# Patient Record
Sex: Male | Born: 1975 | Race: Black or African American | Hispanic: No | Marital: Single | Smoking: Current every day smoker
Health system: Southern US, Community
[De-identification: ages and names within clinical notes are randomized; demographics above are authoritative.]

## PROBLEM LIST (undated history)

## (undated) DIAGNOSIS — N390 Urinary tract infection, site not specified: Secondary | ICD-10-CM

## (undated) HISTORY — PX: HAND SURGERY: SHX662

---

## 1997-10-21 ENCOUNTER — Emergency Department (HOSPITAL_COMMUNITY): Admission: EM | Admit: 1997-10-21 | Discharge: 1997-10-21 | Payer: Self-pay | Admitting: Emergency Medicine

## 1997-10-24 ENCOUNTER — Emergency Department (HOSPITAL_COMMUNITY): Admission: EM | Admit: 1997-10-24 | Discharge: 1997-10-24 | Payer: Self-pay | Admitting: Emergency Medicine

## 1997-10-24 ENCOUNTER — Emergency Department (HOSPITAL_COMMUNITY): Admission: EM | Admit: 1997-10-24 | Discharge: 1997-10-24 | Payer: Self-pay | Admitting: *Deleted

## 1997-10-29 ENCOUNTER — Ambulatory Visit (HOSPITAL_COMMUNITY): Admission: RE | Admit: 1997-10-29 | Discharge: 1997-10-29 | Payer: Self-pay | Admitting: Urology

## 1998-01-13 ENCOUNTER — Emergency Department (HOSPITAL_COMMUNITY): Admission: EM | Admit: 1998-01-13 | Discharge: 1998-01-13 | Payer: Self-pay | Admitting: Emergency Medicine

## 1998-01-21 ENCOUNTER — Emergency Department (HOSPITAL_COMMUNITY): Admission: EM | Admit: 1998-01-21 | Discharge: 1998-01-21 | Payer: Self-pay | Admitting: Endocrinology

## 1998-03-02 ENCOUNTER — Emergency Department (HOSPITAL_COMMUNITY): Admission: EM | Admit: 1998-03-02 | Discharge: 1998-03-02 | Payer: Self-pay

## 1999-11-29 ENCOUNTER — Emergency Department (HOSPITAL_COMMUNITY): Admission: EM | Admit: 1999-11-29 | Discharge: 1999-11-29 | Payer: Self-pay | Admitting: Emergency Medicine

## 2000-11-23 ENCOUNTER — Emergency Department (HOSPITAL_COMMUNITY): Admission: EM | Admit: 2000-11-23 | Discharge: 2000-11-23 | Payer: Self-pay | Admitting: Emergency Medicine

## 2001-10-29 ENCOUNTER — Emergency Department (HOSPITAL_COMMUNITY): Admission: EM | Admit: 2001-10-29 | Discharge: 2001-10-30 | Payer: Self-pay | Admitting: Emergency Medicine

## 2001-11-28 ENCOUNTER — Emergency Department (HOSPITAL_COMMUNITY): Admission: EM | Admit: 2001-11-28 | Discharge: 2001-11-28 | Payer: Self-pay | Admitting: Emergency Medicine

## 2002-05-11 ENCOUNTER — Emergency Department (HOSPITAL_COMMUNITY): Admission: EM | Admit: 2002-05-11 | Discharge: 2002-05-11 | Payer: Self-pay | Admitting: *Deleted

## 2003-12-05 ENCOUNTER — Emergency Department (HOSPITAL_COMMUNITY): Admission: EM | Admit: 2003-12-05 | Discharge: 2003-12-06 | Payer: Self-pay | Admitting: Emergency Medicine

## 2008-12-26 ENCOUNTER — Emergency Department (HOSPITAL_COMMUNITY): Admission: EM | Admit: 2008-12-26 | Discharge: 2008-12-26 | Payer: Self-pay | Admitting: Emergency Medicine

## 2009-08-20 ENCOUNTER — Emergency Department (HOSPITAL_COMMUNITY): Admission: EM | Admit: 2009-08-20 | Discharge: 2009-08-21 | Payer: Self-pay | Admitting: Emergency Medicine

## 2009-08-20 ENCOUNTER — Emergency Department (HOSPITAL_COMMUNITY): Admission: EM | Admit: 2009-08-20 | Discharge: 2009-08-20 | Payer: Self-pay | Admitting: Emergency Medicine

## 2010-01-13 ENCOUNTER — Emergency Department (HOSPITAL_COMMUNITY): Admission: EM | Admit: 2010-01-13 | Discharge: 2010-01-13 | Payer: Self-pay | Admitting: Emergency Medicine

## 2010-09-02 ENCOUNTER — Emergency Department (HOSPITAL_COMMUNITY)
Admission: EM | Admit: 2010-09-02 | Discharge: 2010-09-02 | Disposition: A | Discharge status: Home / Self Care | Payer: Self-pay | Attending: Emergency Medicine | Admitting: Emergency Medicine

## 2010-09-02 DIAGNOSIS — N342 Other urethritis: Secondary | ICD-10-CM | POA: Insufficient documentation

## 2010-09-02 DIAGNOSIS — R109 Unspecified abdominal pain: Secondary | ICD-10-CM | POA: Insufficient documentation

## 2010-09-02 DIAGNOSIS — R319 Hematuria, unspecified: Secondary | ICD-10-CM | POA: Insufficient documentation

## 2010-09-02 DIAGNOSIS — R300 Dysuria: Secondary | ICD-10-CM | POA: Insufficient documentation

## 2010-09-02 LAB — URINALYSIS, ROUTINE W REFLEX MICROSCOPIC
Ketones, ur: NEGATIVE mg/dL
Nitrite: NEGATIVE
Protein, ur: NEGATIVE mg/dL
Urobilinogen, UA: 1 mg/dL (ref 0.0–1.0)

## 2010-09-03 LAB — URINE CULTURE
Colony Count: 3000
Culture  Setup Time: 201202251154

## 2010-09-24 LAB — URINE MICROSCOPIC-ADD ON

## 2010-09-24 LAB — URINALYSIS, ROUTINE W REFLEX MICROSCOPIC
Glucose, UA: NEGATIVE mg/dL
Protein, ur: NEGATIVE mg/dL

## 2010-09-24 LAB — URINE CULTURE: Colony Count: 10000

## 2010-09-27 LAB — GC/CHLAMYDIA PROBE AMP, GENITAL: Chlamydia, DNA Probe: NEGATIVE

## 2010-09-27 LAB — URINALYSIS, ROUTINE W REFLEX MICROSCOPIC
Glucose, UA: 100 mg/dL — AB
Protein, ur: >300 mg/dL — AB
pH: 5 (ref 5.0–8.0)

## 2010-09-27 LAB — POCT I-STAT, CHEM 8
BUN: 10 mg/dL (ref 6–23)
Chloride: 105 mEq/L (ref 96–112)
Creatinine, Ser: 0.8 mg/dL (ref 0.4–1.5)
Potassium: 4.2 mEq/L (ref 3.5–5.1)
Sodium: 136 mEq/L (ref 135–145)

## 2010-09-27 LAB — URINE CULTURE: Culture: NO GROWTH

## 2010-09-27 LAB — URINE MICROSCOPIC-ADD ON

## 2011-11-23 ENCOUNTER — Encounter (HOSPITAL_COMMUNITY): Payer: Self-pay | Admitting: Emergency Medicine

## 2011-11-23 ENCOUNTER — Emergency Department (HOSPITAL_COMMUNITY)
Admission: EM | Admit: 2011-11-23 | Discharge: 2011-11-23 | Disposition: A | Discharge status: Home / Self Care | Payer: Self-pay | Attending: Emergency Medicine | Admitting: Emergency Medicine

## 2011-11-23 DIAGNOSIS — R319 Hematuria, unspecified: Secondary | ICD-10-CM | POA: Insufficient documentation

## 2011-11-23 DIAGNOSIS — N390 Urinary tract infection, site not specified: Secondary | ICD-10-CM | POA: Insufficient documentation

## 2011-11-23 DIAGNOSIS — F17200 Nicotine dependence, unspecified, uncomplicated: Secondary | ICD-10-CM | POA: Insufficient documentation

## 2011-11-23 HISTORY — DX: Urinary tract infection, site not specified: N39.0

## 2011-11-23 LAB — URINALYSIS, ROUTINE W REFLEX MICROSCOPIC
Glucose, UA: NEGATIVE mg/dL
Specific Gravity, Urine: 1.028 (ref 1.005–1.030)
pH: 6 (ref 5.0–8.0)

## 2011-11-23 LAB — URINE MICROSCOPIC-ADD ON

## 2011-11-23 MED ORDER — CIPROFLOXACIN HCL 500 MG PO TABS
500.0000 mg | ORAL_TABLET | Freq: Once | ORAL | Status: AC
  Administered 2011-11-23: 500 mg via ORAL
  Filled 2011-11-23: qty 1

## 2011-11-23 MED ORDER — ALIGN PO CAPS: ORAL_CAPSULE | ORAL | Status: DC

## 2011-11-23 MED ORDER — CIPROFLOXACIN HCL 500 MG PO TABS: 500.0000 mg | ORAL_TABLET | Freq: Two times a day (BID) | ORAL | Status: AC

## 2011-11-23 NOTE — Discharge Instructions (Signed)
Urinary Tract Infection Infections of the urinary tract can start in several places. A bladder infection (cystitis), a kidney infection (pyelonephritis), and a prostate infection (prostatitis) are different types of urinary tract infections (UTIs). They usually get better if treated with medicines (antibiotics) that kill germs. Take all the medicine until it is gone. You may feel better in a few days, but TAKE ALL MEDICINE or the infection may not respond and may become more difficult to treat. HOME CARE INSTRUCTIONS   Drink enough water and fluids to keep the urine clear or pale yellow. Cranberry juice is especially recommended, in addition to large amounts of water.   Avoid caffeine, tea, and carbonated beverages. They tend to irritate the bladder.   Alcohol may irritate the prostate.   Only take over-the-counter or prescription medicines for pain, discomfort, or fever as directed by your caregiver.  To prevent further infections:  Empty the bladder often. Avoid holding urine for long periods of time.  FINDING OUT THE RESULTS OF YOUR TEST Not all test results are available during your visit. If your or your child's test results are not back during the visit, make an appointment with your caregiver to find out the results. Do not assume everything is normal if you have not heard from your caregiver or the medical facility. It is important for you to follow up on all test results. SEEK IMMEDIATE MEDICAL CARE IF:   There is severe back pain or lower abdominal pain.   You develops chills.   There is nausea or vomiting.   There is continued burning or discomfort with urination.  MAKE SURE YOU:   Understand these instructions.   Will watch your condition.   Will get help right away if you are not doing well or get worse.  Document Released: 04/04/2005 Document Revised: 06/14/2011 Document Reviewed: 11/07/2006 [facility] Patient Information 2012 [place], [place].

## 2011-11-23 NOTE — ED Notes (Signed)
Pt presented to the ER with c/o possible UTI, states has HX of the same, pt also reports that he noted bloody urine and clots, pt states not able to see specialist at this time, need some medications to clear possible UTI.

## 2011-11-23 NOTE — ED Provider Notes (Signed)
History     CSN: [id number]  Arrival date & time 11/23/11  0119   First MD Initiated Contact with Patient 11/23/11 [phone]      Chief Complaint  Patient presents with  . Hematuria    (Consider location/radiation/quality/duration/timing/severity/associated sxs/prior treatment) HPI This is a 36 year old black male with a history of urinary tract infections going back to his childhood. He is not aware if he's ever had a urologic workup. He is here for 2 days of hematuria including passage of clots. The symptoms are consistent with prior episodes of urinary tract infection. He denies burning with urination. He denies abdominal pain. He denies fever or chills. He denies nausea or vomiting. He has not taken any medication for this yet.  History reviewed. No pertinent past medical history.  History reviewed. No pertinent past surgical history.  Family History  Problem Relation Age of Onset  . Hypertension Other   . Cancer Other     History  Substance Use Topics  . Smoking status: Current Everyday Smoker    Types: Cigarettes  . Smokeless tobacco: Not on file  . Alcohol Use: No      Review of Systems  All other systems reviewed and are negative.    Allergies  Review of patient's allergies indicates no known allergies.  Home Medications  No current outpatient prescriptions on file.  BP 150/80  Pulse 84  Temp(Src) 98.2 F (36.8 C) (Oral)  Resp 18  SpO2 97%  Physical Exam General: Well-developed, well-nourished male in no acute distress; appearance consistent with age of record HENT: normocephalic, atraumatic Eyes: pupils equal round and reactive to light; extraocular muscles intact Neck: supple Heart: regular rate and rhythm Lungs: clear to auscultation bilaterally Abdomen: soft; nondistended; nontender; bowel sounds present GU: No flank tenderness Extremities: No deformity; full range of motion Neurologic: Awake, alert and oriented; motor function intact in all  extremities and symmetric; no facial droop Skin: Warm and dry Psychiatric: Normal mood and affect    ED Course  Procedures (including critical care time)     MDM   Nursing notes and vitals signs, including pulse oximetry, reviewed.  Summary of this visit's results, reviewed by myself:  Labs:  Results for orders placed during the hospital encounter of 11/23/11  URINALYSIS, ROUTINE W REFLEX MICROSCOPIC      Component Value Range   Color, Urine YELLOW  YELLOW    APPearance CLOUDY (*) CLEAR    Specific Gravity, Urine 1.028  1.005 - 1.030    pH 6.0  5.0 - 8.0    Glucose, UA NEGATIVE  NEGATIVE (mg/dL)   Hgb urine dipstick SMALL (*) NEGATIVE    Bilirubin Urine NEGATIVE  NEGATIVE    Ketones, ur NEGATIVE  NEGATIVE (mg/dL)   Protein, ur NEGATIVE  NEGATIVE (mg/dL)   Urobilinogen, UA 1.0  0.0 - 1.0 (mg/dL)   Nitrite NEGATIVE  NEGATIVE    Leukocytes, UA MODERATE (*) NEGATIVE   URINE MICROSCOPIC-ADD ON      Component Value Range   Squamous Epithelial / LPF FEW (*) RARE    WBC, UA 21-50  <3 (WBC/hpf)   RBC / HPF 3-6  <3 (RBC/hpf)   Bacteria, UA FEW (*) RARE    Urine-Other MUCOUS PRESENT      Imaging Studies: No results found.          Hanley Seamen, MD 11/23/11 [phone]

## 2011-11-24 LAB — URINE CULTURE: Culture  Setup Time: 201305170929

## 2013-01-06 ENCOUNTER — Encounter (HOSPITAL_COMMUNITY): Payer: Self-pay | Admitting: Emergency Medicine

## 2013-01-06 ENCOUNTER — Emergency Department (HOSPITAL_COMMUNITY)
Admission: EM | Admit: 2013-01-06 | Discharge: 2013-01-06 | Disposition: A | Discharge status: Home / Self Care | Payer: Self-pay | Attending: Emergency Medicine | Admitting: Emergency Medicine

## 2013-01-06 DIAGNOSIS — R000 Tachycardia, unspecified: Secondary | ICD-10-CM | POA: Insufficient documentation

## 2013-01-06 DIAGNOSIS — F17200 Nicotine dependence, unspecified, uncomplicated: Secondary | ICD-10-CM | POA: Insufficient documentation

## 2013-01-06 DIAGNOSIS — M7989 Other specified soft tissue disorders: Secondary | ICD-10-CM | POA: Insufficient documentation

## 2013-01-06 DIAGNOSIS — Z87440 Personal history of urinary (tract) infections: Secondary | ICD-10-CM | POA: Insufficient documentation

## 2013-01-06 DIAGNOSIS — I10 Essential (primary) hypertension: Secondary | ICD-10-CM | POA: Insufficient documentation

## 2013-01-06 LAB — POCT I-STAT, CHEM 8
BUN: 20 mg/dL (ref 6–23)
Creatinine, Ser: 0.9 mg/dL (ref 0.50–1.35)
Hemoglobin: 15 g/dL (ref 13.0–17.0)
Potassium: 5 mEq/L (ref 3.5–5.1)
Sodium: 141 mEq/L (ref 135–145)
TCO2: 29 mmol/L (ref 0–100)

## 2013-01-06 MED ORDER — METHOCARBAMOL 750 MG PO TABS: 750.0000 mg | ORAL_TABLET | Freq: Four times a day (QID) | ORAL | Status: DC

## 2013-01-06 MED ORDER — HYDROCHLOROTHIAZIDE 25 MG PO TABS: 12.5000 mg | ORAL_TABLET | Freq: Every day | ORAL | Status: DC

## 2013-01-06 NOTE — Progress Notes (Signed)
   CARE MANAGEMENT ED NOTE 01/06/2013  Patient:  HAYDAN, MANSOURI   Account Number:  [id number]  Date Initiated:  01/06/2013  Documentation initiated by:  Radford Pax  Subjective/Objective Assessment:   Patient presents to ED with abdominal pain and numbness to left leg above his knee.     Subjective/Objective Assessment Detail:     Action/Plan:   Action/Plan Detail:   Anticipated DC Date:       Status Recommendation to Physician:   Result of Recommendation:    Other ED Services  Consult Working Plan    DC Planning Services  Other  PCP issues    Choice offered to / List presented to:            Status of service:  Completed, signed off  ED Comments:   ED Comments Detail:  Patient listed as not having a pcp or insurance.  EDCM provided patient with list of community financial resources, Adult Care Act, Medicaid (number for DSS provided), discount pharmacies such as [facility], [facility] and [facility],  and website needymeds.org. if he needs helpaffording medications.   Also povided patient with discount prescription card.  Patient thankful for information.  No further needs at this time.

## 2013-01-06 NOTE — ED Provider Notes (Signed)
   History    CSN: [id number] Arrival date & time 01/06/13  2008  First MD Initiated Contact with Patient 01/06/13 2042     Chief Complaint  Patient presents with  . Leg numbness   . Leg Swelling   (Consider location/radiation/quality/duration/timing/severity/associated sxs/prior Treatment) The history is provided by the patient.   patient here complaining of bilateral leg edema that is worse with standing and better when he wakes up in the morning. Also notes left lateral thigh numbness is worse with standing. Denies any shortness of breath. No chest pain chest pressure. Symptoms have been present for the past 3-4 weeks. No treatment used for this. No prior history of same. Denies any history of trauma. Past Medical History  Diagnosis Date  . UTI (urinary tract infection)    History reviewed. No pertinent past surgical history. Family History  Problem Relation Age of Onset  . Hypertension Other   . Cancer Other    History  Substance Use Topics  . Smoking status: Current Every Day Smoker    Types: Cigarettes  . Smokeless tobacco: Not on file  . Alcohol Use: No    Review of Systems  All other systems reviewed and are negative.    Allergies  Review of patient's allergies indicates no known allergies.  Home Medications   Current Outpatient Rx  Name  Route  Sig  Dispense  Refill  . Multiple Vitamin (MULTIVITAMIN WITH MINERALS) TABS   Oral   Take 1 tablet by mouth daily.          BP 151/80  Pulse 103  Temp(Src) 98.5 F (36.9 C) (Oral)  Resp 16  Ht 5\' 10"  (1.778 m)  Wt 270 lb (122.471 kg)  BMI 38.74 kg/m2  SpO2 99% Physical Exam  Nursing note and vitals reviewed. Constitutional: He is oriented to person, place, and time. He appears well-developed and well-nourished.  Non-toxic appearance. No distress.  HENT:  Head: Normocephalic and atraumatic.  Eyes: Conjunctivae, EOM and lids are normal. Pupils are equal, round, and reactive to light.  Neck: Normal range  of motion. Neck supple. No tracheal deviation present. No mass present.  Cardiovascular: Regular rhythm and normal heart sounds.  Tachycardia present.  Exam reveals no gallop.   No murmur heard. Pulmonary/Chest: Effort normal and breath sounds normal. No stridor. No respiratory distress. He has no decreased breath sounds. He has no wheezes. He has no rhonchi. He has no rales.  Abdominal: Soft. Normal appearance and bowel sounds are normal. He exhibits no distension. There is no tenderness. There is no rebound and no CVA tenderness.  Musculoskeletal: Normal range of motion. He exhibits no edema and no tenderness.  2 plus pitting edema bilateral, pain along iliotibial band  No cords  Neurological: He is alert and oriented to person, place, and time. He has normal strength. No cranial nerve deficit or sensory deficit. GCS eye subscore is 4. GCS verbal subscore is 5. GCS motor subscore is 6.  Skin: Skin is warm and dry. No abrasion and no rash noted.  Psychiatric: He has a normal mood and affect. His speech is normal and behavior is normal.    ED Course  Procedures (including critical care time) Labs Reviewed - No data to display No results found. No diagnosis found.  MDM  Patient be treated with hydrochlorothiazide for his mild hypertension.  Toy Baker, MD 01/06/13 2149

## 2013-01-06 NOTE — ED Notes (Signed)
Pt states that for 3-4wks he has had pain and numbness in his L leg primarily above his knee. Also notes swelling in his ankles which is visible but he states that it is less than normal. Denies SOB. Works as a Financial risk analyst.

## 2014-03-20 ENCOUNTER — Encounter (HOSPITAL_COMMUNITY): Payer: Self-pay | Admitting: Emergency Medicine

## 2014-03-20 ENCOUNTER — Emergency Department (HOSPITAL_COMMUNITY)
Admission: EM | Admit: 2014-03-20 | Discharge: 2014-03-20 | Disposition: A | Discharge status: Home / Self Care | Payer: Self-pay | Attending: Emergency Medicine | Admitting: Emergency Medicine

## 2014-03-20 DIAGNOSIS — R319 Hematuria, unspecified: Secondary | ICD-10-CM | POA: Insufficient documentation

## 2014-03-20 DIAGNOSIS — F17200 Nicotine dependence, unspecified, uncomplicated: Secondary | ICD-10-CM | POA: Insufficient documentation

## 2014-03-20 DIAGNOSIS — Z87440 Personal history of urinary (tract) infections: Secondary | ICD-10-CM | POA: Insufficient documentation

## 2014-03-20 DIAGNOSIS — R300 Dysuria: Secondary | ICD-10-CM | POA: Insufficient documentation

## 2014-03-20 LAB — URINALYSIS, ROUTINE W REFLEX MICROSCOPIC
BILIRUBIN URINE: NEGATIVE
Glucose, UA: NEGATIVE mg/dL
Hgb urine dipstick: NEGATIVE
KETONES UR: NEGATIVE mg/dL
Leukocytes, UA: NEGATIVE
NITRITE: NEGATIVE
PH: 5.5 (ref 5.0–8.0)
Protein, ur: NEGATIVE mg/dL
Specific Gravity, Urine: 1.029 (ref 1.005–1.030)
Urobilinogen, UA: 1 mg/dL (ref 0.0–1.0)

## 2014-03-20 NOTE — ED Provider Notes (Signed)
Medical screening examination/treatment/procedure(s) were performed by non-physician practitioner and as supervising physician I was immediately available for consultation/collaboration.   EKG Interpretation None       Olivia Mackie, MD 03/20/14 [phone]

## 2014-03-20 NOTE — ED Notes (Signed)
Pt c/o dysuria onset yesterday at work. +hematuria, +diarrhea.

## 2014-03-20 NOTE — ED Provider Notes (Signed)
CSN: [id number]     Arrival date & time 03/20/14  0131 History   First MD Initiated Contact with Patient 03/20/14 0602     Chief Complaint  Patient presents with  . Dysuria     HPI Peter Parker is a 38 y.o. male presenting with a few drops of blood at end of urinary stream and burning with urination last night. He has been drinking fluids ever since and the burning and blood has resolved. Patient describes pressure with urination. Patient denies decreased stream, or trouble starting his stream, genital pain, penile discharge or pain. No testicular pain. Patient has a history of having UTIs since he was 38 years old. No back pain. He had one episode of loose bowels without any blood. No nausea, vomiting or abdominal pain.   Past Medical History  Diagnosis Date  . UTI (urinary tract infection)    Past Surgical History  Procedure Laterality Date  . Hand surgery     Family History  Problem Relation Age of Onset  . Hypertension Other   . Cancer Other    History  Substance Use Topics  . Smoking status: Current Every Day Smoker    Types: Cigarettes  . Smokeless tobacco: Not on file  . Alcohol Use: No    Review of Systems  Constitutional: Negative for fever and chills.  HENT: Negative for congestion and rhinorrhea.   Eyes: Negative for visual disturbance.  Respiratory: Negative for cough and shortness of breath.   Cardiovascular: Negative for chest pain and palpitations.  Gastrointestinal: Negative for nausea, vomiting and diarrhea.  Genitourinary: Positive for dysuria and hematuria.  Musculoskeletal: Negative for back pain and gait problem.  Skin: Negative for rash.  Neurological: Negative for weakness and headaches.      Allergies  Review of patient's allergies indicates no known allergies.  Home Medications   Prior to Admission medications   Not on File   BP 141/83  Pulse 80  Temp(Src) 98.1 F (36.7 C) (Oral)  Resp 18  Ht  (1.778 m)  Wt 287 lb  (130.182 kg)  BMI 41.18 kg/m2  SpO2 94% Physical Exam  Nursing note and vitals reviewed. Constitutional: He appears well-developed and well-nourished. No distress.  HENT:  Head: Normocephalic and atraumatic.  Eyes: Conjunctivae and EOM are normal. Right eye exhibits no discharge. Left eye exhibits no discharge. No scleral icterus.  Cardiovascular: Normal rate, regular rhythm and normal heart sounds.   Pulmonary/Chest: Effort normal and breath sounds normal. No respiratory distress. He has no wheezes.  Abdominal: Soft. Bowel sounds are normal. He exhibits no distension. There is no tenderness.  No CVA tenderness.  Genitourinary: Testes normal and penis normal. No penile tenderness. No discharge found.  Musculoskeletal: Normal range of motion. He exhibits no tenderness.  No back tenderness.  Neurological: He is alert. He exhibits normal muscle tone. Coordination normal.  Skin: Skin is warm and dry. He is not diaphoretic.  Psychiatric: He has a normal mood and affect. His behavior is normal.    ED Course  Procedures (including critical care time) Labs Review Labs Reviewed  URINALYSIS, ROUTINE W REFLEX MICROSCOPIC    Imaging Review No results found.   EKG Interpretation None     Meds given in ED:  Medications - No data to display  There are no discharge medications for this patient.     MDM   Final diagnoses:  Dysuria   [name] is a 38 y.o. male presenting with dysuria and hematuria that  resolved with hydration. VSS patient without abdominal pain, genital pain, penile discharge, back pain. UA without signs of infection. Dysuria most likely due to dehydration. Due to his history of UTIs and hematuria and no PCP or urology work up, recommend establish care with primary care provider and follow up as well a urology follow up especially, if hematuria, dysuria reoccurs and is persistent. Resources and information provided. Patient is afebrile, nontoxic, and in no acute  distress. Patient is appropriate for outpatient management and is stable for discharge.  Discussed return precautions with patient. Discussed all results and patient verbalizes understanding and agrees with plan.     Louann Sjogren, PA-C 03/20/14 [phone]

## 2014-03-20 NOTE — Discharge Instructions (Signed)
Return to the emergency room with worsening of symptoms, new symptoms or with symptoms that are concerning, especially if blood in urine returns, nausea, vomiting, back pain or fevers. Please call your doctor for a followup appointment within 24-48 hours. When you talk to your doctor please let them know that you were seen in the emergency department and have them acquire all of your records so that they can discuss the findings with you and formulate a treatment plan to fully care for your new and ongoing problems.  Establish care with a primary care provider using the resources below. Additionally follow up with a urologist. Information below. Call for an appointment as soon as possible. Continue to drink lots of fluids.    Emergency Department Resource Guide 1) Find a Doctor and Pay Out of Pocket Although you won't have to find out who is covered by your insurance plan, it is a good idea to ask around and get recommendations. You will then need to call the office and see if the doctor you have chosen will accept you as a new patient and what types of options they offer for patients who are self-pay. Some doctors offer discounts or will set up payment plans for their patients who do not have insurance, but you will need to ask so you aren't surprised when you get to your appointment.  2) Contact Your Local Health Department Not all health departments have doctors that can see patients for sick visits, but many do, so it is worth a call to see if yours does. If you don't know where your local health department is, you can check in your phone book. The CDC also has a tool to help you locate your state's health department, and many state websites also have listings of all of their local health departments.  3) Find a Walk-in Clinic If your illness is not likely to be very severe or complicated, you may want to try a walk in clinic. These are popping up all over the country in pharmacies, drugstores,  and shopping centers. They're usually staffed by nurse practitioners or physician assistants that have been trained to treat common illnesses and complaints. They're usually fairly quick and inexpensive. However, if you have serious medical issues or chronic medical problems, these are probably not your best option.  No Primary Care Doctor: - Call Health Connect at  [phone] - they can help you locate a primary care doctor that  accepts your insurance, provides certain services, etc. - Physician Referral Service- [phone]  Chronic Pain Problems: Organization         Address  Phone   Notes  [facility]  [phone] Patients need to be referred by their primary care doctor.   Medication Assistance: Organization         Address  Phone   Notes  [facility] Medication [facility] [address] [place] [place], [place] [address] [phone] --Must be a resident of [facility] -- Must have NO insurance coverage whatsoever (no Medicaid/ Medicare, etc.) -- The pt. MUST have a primary care doctor that directs their care regularly and follows them in the community   [facility]  [phone]   [employer]  [phone]    Agencies that provide inexpensive medical care: Organization         Address  Phone   Notes  [facility]  [phone]   [facility]    [phone]   [facility] [address] [place], [place] [address] [phone]   [facility] [address] [place] [place] [phone]   Planned Parenthood    [phone]   [facility]    [phone]   [facility]  [address], [place] Phone:  [phone], Fax:  [phone] Hours of Operation:  9 am - 6 pm, M-F.  Also accepts Medicaid/Medicare and self-pay.  [facility]  [address], [place] Phone: [phone], Fax: [phone]. Hours of Operation:  8:30 am - 5:30 pm, M-F.  Also accepts Medicaid and self-pay.  [facility] [place] [address], [place] Phone: [phone]   [facility] [address] Natasha Bence [place], [place] [phone], Ext. [phone] Mondays & Thursdays: 7-9 AM.  First 15 patients are seen on a first come, first serve basis.    Medicaid-accepting [facility] Providers:  Organization         Address  Phone   Notes  [facility] [address], [place] [phone] Also accepts self-pay patients.  [facility] [address] Laurell Josephs [place], [place]  [phone]   [facility] [address], [place] [phone]   [facility] [address] [place] [phone]   Renaye Rakers [address], [place]   [phone] Only accepts [place] patients after they have their name applied to their card.   Self-Pay (no insurance) in [facility]:  Organization         Address  Phone   Notes  Sickle Cell Patients, [facility] [address] [place], [place] [phone]   [facility] [address] [place], [place] [phone]   [facility] [place]  [address], [place] [phone]   [facility]/Dr. Osei-Bonsu  [address], [place] or [address], [place] [phone] Phone number for both [place] and [place] locations is the same.  [facility] [address], [place] [phone]   [facility] [address] [place] or [address] [phone]   [facility] [address] [place] [phone], phone; [phone], fax Sees patients 1st and 3rd Saturday of every month.  Must not qualify for public or  private insurance (i.e. Medicaid, Medicare, [place] Health Choice, Veterans' Benefits)  Household income should be no more than 200% of the poverty level The clinic cannot treat you if you are pregnant or think you are pregnant  Sexually transmitted diseases are not treated at the clinic.    Dental Care: Organization         Address  Phone  Notes  [facility] [address] [place], [place] [phone] Accepts children up to age 50 who are enrolled in [place] or [place] Health Choice; pregnant women with a Medicaid card; and children who have applied for Medicaid or [place] Health Choice, but were declined, whose parents can pay a reduced fee at time of service.  [facility]  [address], [place] [phone] Accepts children up to  age 6 who are enrolled in Medicaid or [place] Health Choice; pregnant women with a Medicaid card; and children who have applied for Medicaid or [place] Health Choice, but were declined, whose parents can pay a reduced fee at time of service.  [facility]  [address] [place], [place] [phone] Patients are seen by appointment only. Walk-ins are not accepted. [facility] will see patients 34 years of age and older. Monday - Tuesday (8am-5pm) Most Wednesdays (8:30-5pm) $30 per visit, cash only  [facility] Adult Dental [facility]  [address], [facility] [phone] Patients are seen by appointment only. Walk-ins are not accepted. [facility] will see patients 9 years of age and older. One Wednesday Evening (Monthly: Volunteer Based).  $30 per visit, cash only  [employer]  [phone] for adults; Children under age 35, call Graduate Pediatric Dentistry at [phone]. Children aged 83-14, please call [phone] to request a pediatric application.  Dental services are provided in all areas of dental  care including fillings, crowns and bridges, complete and partial dentures, implants, gum treatment, root canals, and extractions. Preventive care is also provided. Treatment is provided to both adults and children. Patients are selected via a lottery and there is often a waiting list.   [facility] [address], [place]  [phone] [email or web address]   [facility] [address] [place], [place] [phone], Ext. [phone] Second and Fourth Thursday of each month, opens at 6:30 AM; Clinic ends at 9 AM.  Patients are seen on a first-come first-served basis, and a limited number are seen during each clinic.   [facility]  [address] Ether Griffins [place], [place] [phone]   Eligibility Requirements You must have lived in [place], [place], or [place] counties for at least the last three months.   You cannot be eligible for state or federal sponsored [place], including [employer], [place], or [employer].   You generally cannot be eligible for healthcare insurance through your employer.    How to apply: Eligibility screenings are held every Tuesday and Wednesday afternoon from 1:00 pm until 4:00 pm. You do not need an appointment for the interview!  [facility] [address], [place], [place] [phone]   [facility] Health Department  [phone]   [facility] Health Department  [phone]   [facility] Health Department  [phone]    Behavioral Health Resources in the Community: Intensive Outpatient Programs Organization         Address  Phone  Notes  [facility] [address], [place], [place] [phone]   [facility]patient [address] [place], [place] [phone]   [facility] [address] [place], [place]  [phone]   [facility] [address]  [place], [place] [phone] or [phone]    Substance Abuse Resources Organization         Address  Phone  Notes  [facility]  [phone]   [facility]  [phone]   [facility]  [phone]   Floydene Flock  [phone]   Residential & Outpatient Substance Abuse Program  [phone]   Psychological Services Organization         Address  Phone  Notes  [facility]  [phone]   [facility]  [phone]   [facility] Mental Health [phone]  [address] [place] [phone] or [phone]    Mobile Crisis Teams Organization         Address  Phone  Notes  [facility], Mobile Crisis Care Unit  [phone]   [facility]  [address] [place], [place] [phone]   Doristine Locks [address] [place] [phone]    Self-Help/Support Groups Organization         Address  Phone             Notes  [facility] [place] - variety of support groups  336- I7437963 Call for more information  Narcotics Anonymous (NA), [facility] [address], [employer] [place]  2 meetings at this location   Statistician         Address  Phone  Notes  [facility] [address],    [place]  [phone]   [facility]  [address] [place] [address], [place], [place] [phone]   [facility] [address] [place], [place] [phone] Admissions: 8am-3pm M-F  [facility] [address],    [place], [place] [phone]   [facility] [address] [place], [place], [place] [phone]   [facility] [address],  [place], [place] [phone]   [facility] - Intensive Outpatient [address]ure[address] Joseph[address], [place], [place] [phone]   [facility] ([facility] [address] [place]  [place], [place] [phone] or [phone]   [facility] ([facility]) [address], [place], [place] [phone] Accepts Medicaid  [facility] [address],  [place] [phone] Substance Abuse/Addiction Treatment   [facility] Organization         Address  Phone  Notes  [facility]  [phone]   Angie Fava, PhD [address] Ervin Knack [place], [place]   [phone] or [phone]   [facility]   [address] [place], [place] [phone]   [facility] [address], [place], [place] [phone] Insurance/Medicaid/sponsorship through [facility] and Families [address]                                    [place], [place] [phone] Therapy/tele-psych/case  [facility] [address], [place] [phone]    Dr. Lolly Mustache  [phone]   [facility]  [employer] [facility] Dept. 1) [address] [place] 2) [address] [place] 3)  [address], [place] [phone]  [phone]   [facility] Child Abuse Hotline 223-055-8103 or [phone] (After Hours)

## 2014-08-16 ENCOUNTER — Encounter (HOSPITAL_COMMUNITY): Payer: Self-pay | Admitting: *Deleted

## 2014-08-16 ENCOUNTER — Emergency Department (HOSPITAL_COMMUNITY)
Admission: EM | Admit: 2014-08-16 | Discharge: 2014-08-16 | Disposition: A | Discharge status: Home / Self Care | Payer: Self-pay | Attending: Emergency Medicine | Admitting: Emergency Medicine

## 2014-08-16 DIAGNOSIS — Z720 Tobacco use: Secondary | ICD-10-CM | POA: Insufficient documentation

## 2014-08-16 DIAGNOSIS — N390 Urinary tract infection, site not specified: Secondary | ICD-10-CM | POA: Insufficient documentation

## 2014-08-16 LAB — URINALYSIS, ROUTINE W REFLEX MICROSCOPIC
Bilirubin Urine: NEGATIVE
Glucose, UA: NEGATIVE mg/dL
Hgb urine dipstick: NEGATIVE
Ketones, ur: NEGATIVE mg/dL
Nitrite: NEGATIVE
Protein, ur: NEGATIVE mg/dL
Specific Gravity, Urine: 1.031 — ABNORMAL HIGH (ref 1.005–1.030)
Urobilinogen, UA: 1 mg/dL (ref 0.0–1.0)
pH: 6 (ref 5.0–8.0)

## 2014-08-16 LAB — URINE MICROSCOPIC-ADD ON

## 2014-08-16 MED ORDER — CIPROFLOXACIN HCL 500 MG PO TABS: 500.0000 mg | ORAL_TABLET | Freq: Two times a day (BID) | ORAL | Status: DC

## 2014-08-16 NOTE — ED Notes (Signed)
Pt concerned he may have a bladder infection due to onset of hematuria last night - pt denies any fever. Pt admits to some pain/pressure while voiding.

## 2014-08-16 NOTE — ED Provider Notes (Addendum)
CSN: [id number]     Arrival date & time 08/16/14  0102 History   First MD Initiated Contact with Patient 08/16/14 0141     Chief Complaint  Patient presents with  . Hematuria     (Consider location/radiation/quality/duration/timing/severity/associated sxs/prior Treatment) Patient is a 39 y.o. male presenting with hematuria. The history is provided by the patient.  Hematuria This is a recurrent problem. The current episode started 12 to 24 hours ago. The problem occurs constantly. The problem has been gradually worsening. Associated symptoms comments: Intermittent mild suprapubic pain. No fever no penile discharge no rectal pain.. Exacerbated by: Urinating. Nothing relieves the symptoms. He has tried nothing for the symptoms. The treatment provided no relief.    Past Medical History  Diagnosis Date  . UTI (urinary tract infection)    Past Surgical History  Procedure Laterality Date  . Hand surgery     Family History  Problem Relation Age of Onset  . Hypertension Other   . Cancer Other    History  Substance Use Topics  . Smoking status: Current Every Day Smoker -- 0.50 packs/day    Types: Cigarettes  . Smokeless tobacco: Not on file  . Alcohol Use: No    Review of Systems  Constitutional: Negative for fever and chills.  Genitourinary: Positive for hematuria.  All other systems reviewed and are negative.     Allergies  Review of patient's allergies indicates no known allergies.  Home Medications   Prior to Admission medications   Not on File   BP 152/84 mmHg  Pulse 95  Temp(Src) 97.9 F (36.6 C) (Oral)  Resp 18  Ht 5\' 10"  (1.778 m)  Wt 190 lb (86.183 kg)  BMI 27.26 kg/m2  SpO2 98% Physical Exam  Constitutional: He is oriented to person, place, and time. He appears well-developed and well-nourished. No distress.  HENT:  Head: Normocephalic and atraumatic.  Mouth/Throat: Oropharynx is clear and moist.  Eyes: Conjunctivae and EOM are normal. Pupils are  equal, round, and reactive to light.  Neck: Normal range of motion. Neck supple.  Cardiovascular: Normal rate, regular rhythm and intact distal pulses.   No murmur heard. Pulmonary/Chest: Effort normal and breath sounds normal. No respiratory distress. He has no wheezes. He has no rales.  Abdominal: Soft. He exhibits no distension. There is no tenderness. There is no rebound, no guarding and no CVA tenderness.  Musculoskeletal: Normal range of motion. He exhibits no edema or tenderness.  Neurological: He is alert and oriented to person, place, and time.  Skin: Skin is warm and dry. No rash noted. No erythema.  Psychiatric: He has a normal mood and affect. His behavior is normal.  Nursing note and vitals reviewed.   ED Course  Procedures (including critical care time) Labs Review Labs Reviewed  URINALYSIS, ROUTINE W REFLEX MICROSCOPIC - Abnormal; Notable for the following:    APPearance HAZY (*)    Specific Gravity, Urine 1.031 (*)    Leukocytes, UA SMALL (*)    All other components within normal limits  URINE MICROSCOPIC-ADD ON - Abnormal; Notable for the following:    Crystals CA OXALATE CRYSTALS (*)    All other components within normal limits    Imaging Review No results found.   EKG Interpretation None      MDM   Final diagnoses:  UTI (lower urinary tract infection)    Patient presenting here with concern for a new urinary tract infection that started yesterday. He states since he was 3T gets  intermittent urinary tract infections. He denies any STD type symptoms. No symptoms concerning for pyelonephritis or kidney stone. Will treat with antibiotic his UA is consistent with urinary tract infection.    Gwyneth Sprout, MD 08/16/14 2956  Gwyneth Sprout, MD 08/16/14 2130  Gwyneth Sprout, MD 08/16/14 8657

## 2014-08-16 NOTE — ED Notes (Signed)
Awake. Verbally responsive. A/O x4. Resp even and unlabored. No audible adventitious breath sounds noted. ABC's intact.  

## 2014-08-16 NOTE — ED Notes (Signed)
Awake. Verbally responsive. A/O x4. Resp even and unlabored. No audible adventitious breath sounds noted. ABC's intact. Pt reported hematuria, urinary frequency/urgency at times, and abd pressure. Denies foul odor. Family at bedside.

## 2014-08-17 LAB — URINE CULTURE

## 2014-12-18 ENCOUNTER — Emergency Department (HOSPITAL_COMMUNITY)
Admission: EM | Admit: 2014-12-18 | Discharge: 2014-12-19 | Disposition: A | Discharge status: Home / Self Care | Payer: Self-pay | Attending: Emergency Medicine | Admitting: Emergency Medicine

## 2014-12-18 ENCOUNTER — Encounter (HOSPITAL_COMMUNITY): Payer: Self-pay | Admitting: *Deleted

## 2014-12-18 ENCOUNTER — Emergency Department (HOSPITAL_COMMUNITY): Payer: Self-pay

## 2014-12-18 DIAGNOSIS — Z792 Long term (current) use of antibiotics: Secondary | ICD-10-CM | POA: Insufficient documentation

## 2014-12-18 DIAGNOSIS — R0789 Other chest pain: Secondary | ICD-10-CM

## 2014-12-18 DIAGNOSIS — Z87448 Personal history of other diseases of urinary system: Secondary | ICD-10-CM | POA: Insufficient documentation

## 2014-12-18 DIAGNOSIS — Z720 Tobacco use: Secondary | ICD-10-CM | POA: Insufficient documentation

## 2014-12-18 DIAGNOSIS — M940 Chondrocostal junction syndrome [Tietze]: Secondary | ICD-10-CM

## 2014-12-18 MED ORDER — OXYCODONE-ACETAMINOPHEN 5-325 MG PO TABS
2.0000 | ORAL_TABLET | Freq: Once | ORAL | Status: AC
  Administered 2014-12-18: 2 via ORAL
  Filled 2014-12-18: qty 2

## 2014-12-18 NOTE — ED Notes (Signed)
Patient stated that he was standing outside when he began to cough, he felt something "snap" in his back. Pt reports having back pains starting 2 weeks ago as well but thought it would resolve on its own. Pt took tylenol 3 which helped some with the pain. Pain is worsening this evening 10/10.

## 2014-12-18 NOTE — ED Provider Notes (Signed)
CSN: [id number]     Arrival date & time 12/18/14  2212 History   This chart was scribed for Peter Madura, PA-C working with Pricilla Loveless, MD, by Elveria Rising, ED Scribe. This patient was seen in room WTR8/WTR8 and the patient's care was started at 11:02 PM.   Chief Complaint  Patient presents with  . Back Pain    lower   The history is provided by the patient. No language interpreter was used.   HPI Comments: Peter Parker is a 39 y.o. male who presents to the Emergency Department complaining of worsening, nonradiating back pain today after "snap" sensation in back with coughing. Patient reports worsened pain with deep breathing, coughing, and movement. Patient reports onset of intermittent back pain two weeks ago, worsened when lying down at night. Patient reports treatment with an old prescribed pain medication, Tylenol 3, at home, which provided mild relief. Patient reports occasional heavy lifting at work. Patient denies personal history of cancer or IV drug use. Patient denies bladder/bowel incontinence, urinary symptoms, lower extremity weakness, numbness.    Past Medical History  Diagnosis Date  . UTI (urinary tract infection)   . UTI (urinary tract infection)    Past Surgical History  Procedure Laterality Date  . Hand surgery     Family History  Problem Relation Age of Onset  . Hypertension Other   . Cancer Other    History  Substance Use Topics  . Smoking status: Current Every Day Smoker -- 0.50 packs/day    Types: Cigarettes  . Smokeless tobacco: Not on file  . Alcohol Use: No    Review of Systems  Constitutional: Negative for fever and chills.  Gastrointestinal: Negative for diarrhea and constipation.  Genitourinary: Negative for dysuria and hematuria.  Musculoskeletal: Positive for back pain.  Neurological: Negative for weakness and numbness.  All other systems reviewed and are negative.   Allergies  Review of patient's allergies indicates no known  allergies.  Home Medications   Prior to Admission medications   Medication Sig Start Date End Date Taking? Authorizing Provider  ciprofloxacin (CIPRO) 500 MG tablet Take 1 tablet (500 mg total) by mouth 2 (two) times daily. 08/16/14   Gwyneth Sprout, MD  ibuprofen (ADVIL,MOTRIN) 600 MG tablet Take 1 tablet (600 mg total) by mouth every 6 (six) hours as needed. 12/19/14   Peter Madura, PA-C  oxyCODONE-acetaminophen (PERCOCET/ROXICET) 5-325 MG per tablet Take 1-2 tablets by mouth every 6 (six) hours as needed for severe pain. 12/19/14   Peter Madura, PA-C   Triage Vitals: BP 141/80 mmHg  Pulse 67  Temp(Src) 98.4 F (36.9 C) (Oral)  Resp 18  Ht 5\' 10"  (1.778 m)  Wt 280 lb (127.007 kg)  BMI 40.18 kg/m2  SpO2 97%  Physical Exam  Constitutional: He is oriented to person, place, and time. He appears well-developed and well-nourished. No distress.   Nontoxic/nonseptic appearing  HENT:  Head: Normocephalic and atraumatic.  Eyes: Conjunctivae and EOM are normal. No scleral icterus.  Neck: Normal range of motion.  Cardiovascular: Normal rate, regular rhythm and intact distal pulses.   Pulmonary/Chest: Effort normal. No respiratory distress. He has no wheezes. He has no rales. He exhibits tenderness.  Tenderness to palpation noted to the posterior left chest wall, along the posterior axillary line. No crepitus or deformity. Chest expansion symmetric.  Musculoskeletal: Normal range of motion.   No tenderness to palpation to the thoracic or lumbar midline. No bony deformities, step-offs, or crepitus.  Neurological: He is alert and  oriented to person, place, and time. He exhibits normal muscle tone. Coordination normal.  Skin: Skin is warm and dry. No rash noted. He is not diaphoretic. No erythema. No pallor.  Psychiatric: He has a normal mood and affect. His behavior is normal.  Nursing note and vitals reviewed.   ED Course  Procedures (including critical care time)  COORDINATION OF  CARE: 11:12 PM- Discussed treatment plan with patient at bedside and patient agreed to plan.   Labs Review Labs Reviewed - No data to display  Imaging Review Dg Ribs Unilateral W/chest Left  12/19/2014   CLINICAL DATA:  Nontraumatic left posterior chest wall pain for 2 weeks.  EXAM: LEFT RIBS AND CHEST - 3+ VIEW  COMPARISON:  None.  FINDINGS: No fracture or other bone lesions are seen involving the ribs. There is no evidence of pneumothorax or pleural effusion. Both lungs are clear. Heart size and mediastinal contours are within normal limits.  IMPRESSION: Negative.   Electronically Signed   By: Ellery Plunk M.D.   On: 12/19/2014 00:27     EKG Interpretation None      MDM   Final diagnoses:  Chest wall pain  Costochondritis    39 year old male presents to the emergency department for further evaluation of chest wall pain. Symptoms worsened while coughing and patient reports hearing a "snap". Patient with tenderness to palpation to his posterior left chest wall. No crepitus or deformity. Chest expansion symmetric. X-ray ordered to evaluate for symptoms. This shows no evidence of pneumothorax or rib fracture. Patient in no signs of respiratory distress. No hypoxia. Will manage symptoms as outpatient with pain medication and incentive spirometer. Patient advised to use this once per hour while awake. Patient recommended to follow up with his primary care doctor for further evaluation of symptoms. Return precautions discussed and provided. Patient agreeable to plan with no unaddressed concerns.  I personally performed the services described in this documentation, which was scribed in my presence. The recorded information has been reviewed and is accurate.   Filed Vitals:   12/18/14 2219 12/18/14 2226 12/18/14 2227  BP: 141/80 141/80   Pulse: 92 67   Temp: 98.4 F (36.9 C)    TempSrc: Oral    Resp: 18 18   Height:   (1.778 m)   Weight:  200 lb (90.719 kg) 280 lb (127.007  kg)  SpO2: 97% 97%       Peter Madura, PA-C 12/19/14 0331  Pricilla Loveless, MD 12/19/14 2155

## 2014-12-18 NOTE — ED Notes (Signed)
Incentive spirometer teaching done with pt

## 2014-12-19 MED ORDER — IBUPROFEN 600 MG PO TABS: 600.0000 mg | ORAL_TABLET | Freq: Four times a day (QID) | ORAL | Status: DC | PRN

## 2014-12-19 MED ORDER — OXYCODONE-ACETAMINOPHEN 5-325 MG PO TABS: 1.0000 | ORAL_TABLET | Freq: Four times a day (QID) | ORAL | Status: DC | PRN

## 2014-12-19 NOTE — Discharge Instructions (Signed)
Chest Wall Pain °Chest wall pain is pain in or around the bones and muscles of your chest. It may take up to 6 weeks to get better. It may take longer if you must stay physically active in your work and activities.  °CAUSES  °Chest wall pain may happen on its own. However, it may be caused by: °· A viral illness like the flu. °· Injury. °· Coughing. °· Exercise. °· Arthritis. °· Fibromyalgia. °· Shingles. °HOME CARE INSTRUCTIONS  °· Avoid overtiring physical activity. Try not to strain or perform activities that cause pain. This includes any activities using your chest or your abdominal and side muscles, especially if heavy weights are used. °· Put ice on the sore area. °· Put ice in a plastic bag. °· Place a towel between your skin and the bag. °· Leave the ice on for 15-20 minutes per hour while awake for the first 2 days. °· Only take over-the-counter or prescription medicines for pain, discomfort, or fever as directed by your caregiver. °SEEK IMMEDIATE MEDICAL CARE IF:  °· Your pain increases, or you are very uncomfortable. °· You have a fever. °· Your chest pain becomes worse. °· You have new, unexplained symptoms. °· You have nausea or vomiting. °· You feel sweaty or lightheaded. °· You have a cough with phlegm (sputum), or you cough up blood. °MAKE SURE YOU:  °· Understand these instructions. °· Will watch your condition. °· Will get help right away if you are not doing well or get worse. °Document Released: 06/25/2005 Document Revised: 09/17/2011 Document Reviewed: 02/19/2011 °[employer]® Patient Information ©2015 [employer]. This information is not intended to replace advice given to you by your health care provider. Make sure you discuss any questions you have with your health care provider. ° °Costochondritis °Costochondritis is a condition in which the tissue (cartilage) that connects your ribs with your breastbone (sternum) becomes irritated. It causes pain in the chest and rib area. It usually goes  away on its own over time. °HOME CARE °· Avoid activities that wear you out. °· Do not strain your ribs. Avoid activities that use your: °¨ Chest. °¨ Belly. °¨ Side muscles. °· Put ice on the area for the first 2 days after the pain starts. °¨ Put ice in a plastic bag. °¨ Place a towel between your skin and the bag. °¨ Leave the ice on for 20 minutes, 2-3 times a day. °· Only take medicine as told by your doctor. °GET HELP IF: °· You have redness or puffiness (swelling) in the rib area. °· Your pain does not go away with rest or medicine. °GET HELP RIGHT AWAY IF:  °· Your pain gets worse. °· You are very uncomfortable. °· You have trouble breathing. °· You cough up blood. °· You start sweating or throwing up (vomiting). °· You have a fever or lasting symptoms for more than 2-3 days. °· You have a fever and your symptoms suddenly get worse. °MAKE SURE YOU:  °· Understand these instructions. °· Will watch your condition. °· Will get help right away if you are not doing well or get worse. °Document Released: 12/12/2007 Document Revised: 02/25/2013 Document Reviewed: 01/27/2013 °Exit[employer]® Patient Information ©2015 [employer]. This information is not intended to replace advice given to you by your health care provider. Make sure you discuss any questions you have with your health care provider. ° °

## 2016-05-10 ENCOUNTER — Emergency Department (HOSPITAL_COMMUNITY)
Admission: EM | Admit: 2016-05-10 | Discharge: 2016-05-11 | Disposition: A | Discharge status: Home / Self Care | Payer: 59 | Attending: Emergency Medicine | Admitting: Emergency Medicine

## 2016-05-10 ENCOUNTER — Encounter (HOSPITAL_COMMUNITY): Payer: Self-pay

## 2016-05-10 ENCOUNTER — Emergency Department (HOSPITAL_COMMUNITY): Payer: 59

## 2016-05-10 DIAGNOSIS — F17210 Nicotine dependence, cigarettes, uncomplicated: Secondary | ICD-10-CM | POA: Insufficient documentation

## 2016-05-10 DIAGNOSIS — R062 Wheezing: Secondary | ICD-10-CM | POA: Diagnosis not present

## 2016-05-10 DIAGNOSIS — R0789 Other chest pain: Secondary | ICD-10-CM | POA: Insufficient documentation

## 2016-05-10 DIAGNOSIS — R071 Chest pain on breathing: Secondary | ICD-10-CM | POA: Diagnosis present

## 2016-05-10 LAB — BASIC METABOLIC PANEL
ANION GAP: 8 (ref 5–15)
BUN: 16 mg/dL (ref 6–20)
CO2: 25 mmol/L (ref 22–32)
Calcium: 8.6 mg/dL — ABNORMAL LOW (ref 8.9–10.3)
Chloride: 103 mmol/L (ref 101–111)
Creatinine, Ser: 1 mg/dL (ref 0.61–1.24)
Glucose, Bld: 90 mg/dL (ref 65–99)
Potassium: 3.6 mmol/L (ref 3.5–5.1)
SODIUM: 136 mmol/L (ref 135–145)

## 2016-05-10 LAB — CBC
HCT: 47 % (ref 39.0–52.0)
HEMOGLOBIN: 15.6 g/dL (ref 13.0–17.0)
MCH: 29.6 pg (ref 26.0–34.0)
MCHC: 33.2 g/dL (ref 30.0–36.0)
MCV: 89.2 fL (ref 78.0–100.0)
Platelets: 315 10*3/uL (ref 150–400)
RBC: 5.27 MIL/uL (ref 4.22–5.81)
RDW: 15.2 % (ref 11.5–15.5)
WBC: 14.8 10*3/uL — AB (ref 4.0–10.5)

## 2016-05-10 LAB — I-STAT TROPONIN, ED: TROPONIN I, POC: 0.01 ng/mL (ref 0.00–0.08)

## 2016-05-10 NOTE — ED Notes (Signed)
Below order not completed by EW. 

## 2016-05-10 NOTE — ED Notes (Signed)
Patient transported to X-ray 

## 2016-05-10 NOTE — ED Triage Notes (Signed)
PT C/O RIGHT-SIDED CHEST PAIN X2 DAYS. DENIES N/V. PT STS HE HAD FLU-LIKE SYMPTOMS 1 WEEK AGO.

## 2016-05-11 MED ORDER — ALBUTEROL (5 MG/ML) CONTINUOUS INHALATION SOLN
10.0000 mg/h | INHALATION_SOLUTION | RESPIRATORY_TRACT | Status: DC
  Administered 2016-05-11: 10 mg/h via RESPIRATORY_TRACT
  Filled 2016-05-11: qty 20

## 2016-05-11 MED ORDER — IPRATROPIUM BROMIDE 0.02 % IN SOLN
1.0000 mg | Freq: Once | RESPIRATORY_TRACT | Status: AC
  Administered 2016-05-11: 1 mg via RESPIRATORY_TRACT
  Filled 2016-05-11: qty 5

## 2016-05-11 MED ORDER — PREDNISONE 20 MG PO TABS
60.0000 mg | ORAL_TABLET | Freq: Once | ORAL | Status: AC
  Administered 2016-05-11: 60 mg via ORAL
  Filled 2016-05-11: qty 3

## 2016-05-11 MED ORDER — HYDROCODONE-ACETAMINOPHEN 5-325 MG PO TABS
1.0000 | ORAL_TABLET | Freq: Once | ORAL | Status: AC
Start: 2016-05-11 — End: 2016-05-11
  Administered 2016-05-11: 1 via ORAL
  Filled 2016-05-11: qty 1

## 2016-05-11 MED ORDER — PREDNISONE 20 MG PO TABS: 60.0000 mg | ORAL_TABLET | Freq: Every day | ORAL | 0 refills | Status: AC

## 2016-05-11 MED ORDER — AZITHROMYCIN 250 MG PO TABS
500.0000 mg | ORAL_TABLET | Freq: Once | ORAL | Status: AC
  Administered 2016-05-11: 500 mg via ORAL
  Filled 2016-05-11: qty 2

## 2016-05-11 MED ORDER — ALBUTEROL SULFATE HFA 108 (90 BASE) MCG/ACT IN AERS
2.0000 | INHALATION_SPRAY | Freq: Once | RESPIRATORY_TRACT | Status: AC
  Administered 2016-05-11: 2 via RESPIRATORY_TRACT
  Filled 2016-05-11: qty 6.7

## 2016-05-11 MED ORDER — AZITHROMYCIN 250 MG PO TABS: 250.0000 mg | ORAL_TABLET | Freq: Every day | ORAL | 0 refills | Status: AC

## 2016-05-11 NOTE — ED Provider Notes (Signed)
[facility]-EMERGENCY DEPT Provider Note   CSN: [id number] Arrival date & time: 05/10/16  2216  By signing my name below, I, Peter Parker, attest that this documentation has been prepared under the direction and in the presence of Tomasita Crumble, MD. Electronically Signed: Soijett Parker, ED Scribe. 05/11/16. 12:56 AM.   History   Chief Complaint Chief Complaint  Patient presents with  . Chest Pain    HPI  Peter Parker is a 40 y.o. male who presents to the Emergency Department complaining of right sided CP onset 2 days. Pt states that his right sided CP is worsened with movement, cough, and deep breathing. Pt denies alleviating factors for his right sided CP. He states that he is having associated symptoms of SOB, rhinorrhea, resolved subjective fever, cough, and resolved diarrhea. He states that he has not tried any medications for the relief of his symptoms. He denies nausea, vomiting, and any other symptoms. Denies PMHx of MI, PE, or DVT. Denies hx of asthma at this time.   The history is provided by the patient. No language interpreter was used.    Past Medical History:  Diagnosis Date  . UTI (urinary tract infection)   . UTI (urinary tract infection)     There are no active problems to display for this patient.   Past Surgical History:  Procedure Laterality Date  . HAND SURGERY         Home Medications    Prior to Admission medications   Medication Sig Start Date End Date Taking? Authorizing Provider  naproxen sodium (ANAPROX) 220 MG tablet Take 440 mg by mouth 2 (two) times daily as needed (chest pain).   Yes Historical Provider, MD  trimethoprim (TRIMPEX) 100 MG tablet Take 100 mg by mouth daily.   Yes Historical Provider, MD    Family History Family History  Problem Relation Age of Onset  . Hypertension Other   . Cancer Other     Social History Social History  Substance Use Topics  . Smoking status: Current Every Day Smoker    Packs/day: 0.50    Types:  Cigarettes  . Smokeless tobacco: Never Used  . Alcohol use No     Allergies   Review of patient's allergies indicates no known allergies.   Review of Systems Review of Systems A complete 10 system review of systems was obtained and all systems are negative except as noted in the HPI and PMH.   Physical Exam Updated Vital Signs BP (!) 147/103 (BP Location: Right Arm)   Pulse 80   Temp 98 F (36.7 C) (Oral)   Resp 23   Ht 5\' 10"  (1.778 m)   Wt 255 lb (115.7 kg)   SpO2 96%   BMI 36.59 kg/m   Physical Exam  Constitutional: He is oriented to person, place, and time. Vital signs are normal. He appears well-developed and well-nourished.  Non-toxic appearance. He does not appear ill. No distress.  HENT:  Head: Normocephalic and atraumatic.  Nose: Nose normal.  Mouth/Throat: Oropharynx is clear and moist. No oropharyngeal exudate.  Eyes: Conjunctivae and EOM are normal. Pupils are equal, round, and reactive to light. No scleral icterus.  Neck: Normal range of motion. Neck supple. No tracheal deviation, no edema, no erythema and normal range of motion present. No thyroid mass and no thyromegaly present.  Cardiovascular: Normal rate, regular rhythm, S1 normal, S2 normal, normal heart sounds, intact distal pulses and normal pulses.  Exam reveals no gallop and no friction rub.  No murmur heard. Pulmonary/Chest: Effort normal. No respiratory distress. He has wheezes. He has no rhonchi. He has no rales.  Bilateral wheezing worse on left side  Abdominal: Soft. Normal appearance and bowel sounds are normal. He exhibits no distension, no ascites and no mass. There is no hepatosplenomegaly. There is no tenderness. There is no rebound, no guarding and no CVA tenderness.  Musculoskeletal: Normal range of motion. He exhibits no edema or tenderness.  Lymphadenopathy:    He has no cervical adenopathy.  Neurological: He is alert and oriented to person, place, and time. He has normal strength. No  cranial nerve deficit or sensory deficit.  Skin: Skin is warm, dry and intact. No petechiae and no rash noted. He is not diaphoretic. No erythema. No pallor.  Nursing note and vitals reviewed.    ED Treatments / Results  DIAGNOSTIC STUDIES: Oxygen Saturation is 97% on RA, nl by my interpretation.    COORDINATION OF CARE: 12:55 AM Discussed treatment plan with pt at bedside which includes labs, CXR, EKG, breathing treatment, steroid Rx, and pt agreed to plan.   Labs (all labs ordered are listed, but only abnormal results are displayed) Labs Reviewed  BASIC METABOLIC PANEL - Abnormal; Notable for the following:       Result Value   Calcium 8.6 (*)    All other components within normal limits  CBC - Abnormal; Notable for the following:    WBC 14.8 (*)    All other components within normal limits  I-STAT TROPOININ, ED    EKG  EKG Interpretation  Date/Time:  Thursday May 10 2016 22:22:23 EDT Ventricular Rate:  77 PR Interval:    QRS Duration: 93 QT Interval:  388 QTC Calculation: 440 R Axis:   -24 Text Interpretation:  Sinus rhythm Left axis deviation No old tracing to compare Confirmed by Erroll LunaOni, Lether Tesch Ayokunle [phone]([id number]) on 05/11/2016 12:03:58 AM       Radiology Dg Chest 2 View  Result Date: 05/10/2016 CLINICAL DATA:  Initial evaluation for acute cough and upper mid chest pain for 2 days. EXAM: CHEST  2 VIEW COMPARISON:  Prior radiograph from 12/18/2014. FINDINGS: The cardiac and mediastinal silhouettes are stable in size and contour, and remain within normal limits. The lungs are normally inflated. No airspace consolidation, pleural effusion, or pulmonary edema is identified. There is no pneumothorax. Minimal left basilar atelectasis/scar. No acute osseous abnormality identified. IMPRESSION: Minimal left basilar atelectasis/scar. No other active cardiopulmonary disease. Electronically Signed   By: Rise MuBenjamin  McClintock M.D.   On: 05/10/2016 23:05    Procedures Procedures  (including critical care time)  Medications Ordered in ED Medications  albuterol (PROVENTIL,VENTOLIN) solution continuous neb (10 mg/hr Nebulization New Bag/Given 05/11/16 0118)  ipratropium (ATROVENT) nebulizer solution 1 mg (1 mg Nebulization Given 05/11/16 0118)  predniSONE (DELTASONE) tablet 60 mg (60 mg Oral Given 05/11/16 0111)  azithromycin (ZITHROMAX) tablet 500 mg (500 mg Oral Given 05/11/16 0110)  HYDROcodone-acetaminophen (NORCO/VICODIN) 5-325 MG per tablet 1 tablet (1 tablet Oral Given 05/11/16 0131)     Initial Impression / Assessment and Plan / ED Course  I have reviewed the triage vital signs and the nursing notes.  Pertinent labs & imaging results that were available during my care of the patient were reviewed by me and considered in my medical decision making (see chart for details).  Clinical Course    Patient presents to the ED for CP.  History is concerning for CAP.  In addition, PE reveals asymmetric wheezing, worse on  the left.  He was given breathing treatment and prednisone.  Will treat with 5 days of azithro despite negative CXR.  PCP fu advised within 3 days.  He appears well and in NAD.  Vs remain within his normal limits and he is safe for DC.  Final Clinical Impressions(s) / ED Diagnoses   Final diagnoses:  Wheezing    New Prescriptions Current Discharge Medication List       I personally performed the services described in this documentation, which was scribed in my presence. The recorded information has been reviewed and is accurate.       Tomasita CrumbleAdeleke Catrice Zuleta, MD 05/11/16 [phone]

## 2016-05-11 NOTE — ED Notes (Signed)
Medicated for chest wall pain per order with Norco 1 tab po

## 2018-02-18 IMAGING — CR DG CHEST 2V
2 series · 2 of 2 positions shown · non-contrast
Comparison: Prior radiograph from 12/18/2014.

CLINICAL DATA: Initial evaluation for acute cough and upper mid
chest pain for 2 days.

EXAM:
CHEST  2 VIEW

[w chest pa]
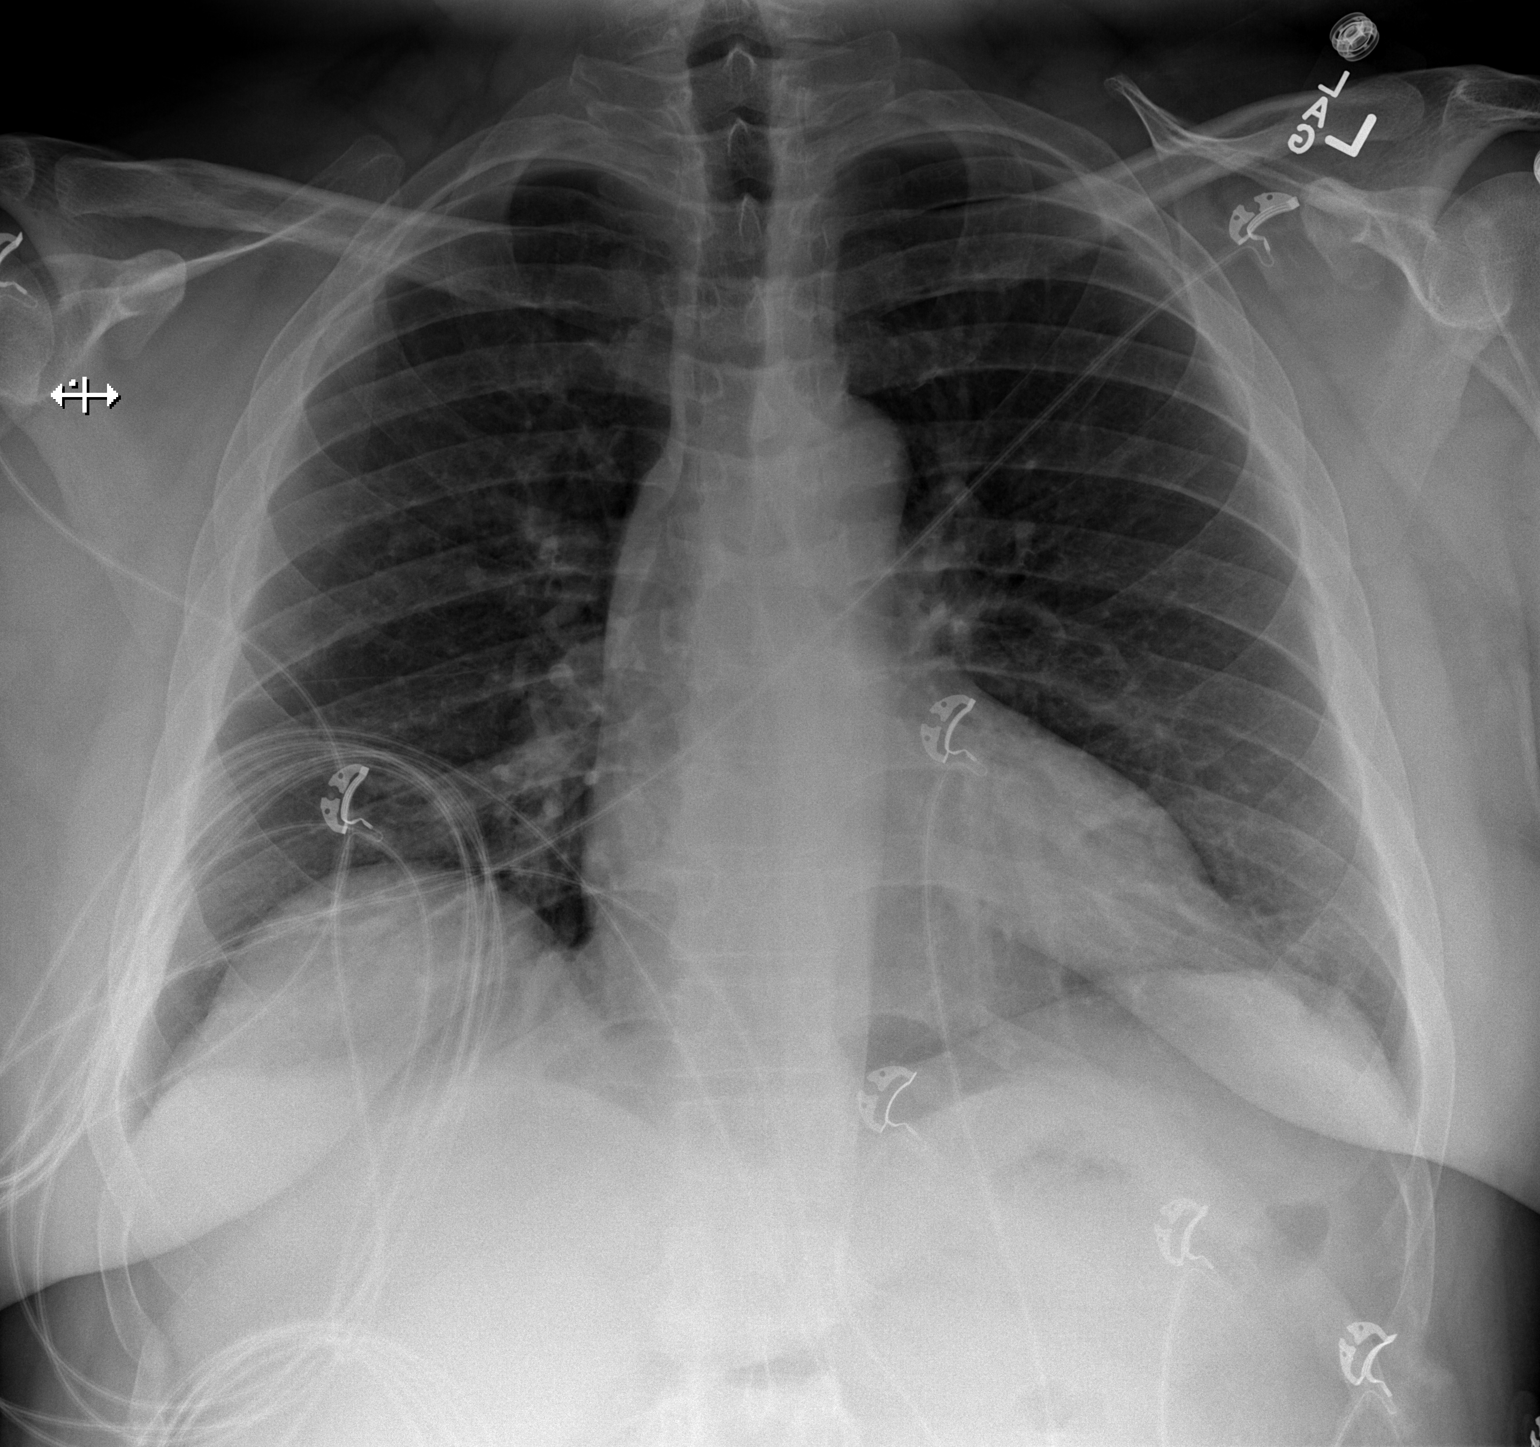

[w chest lat]
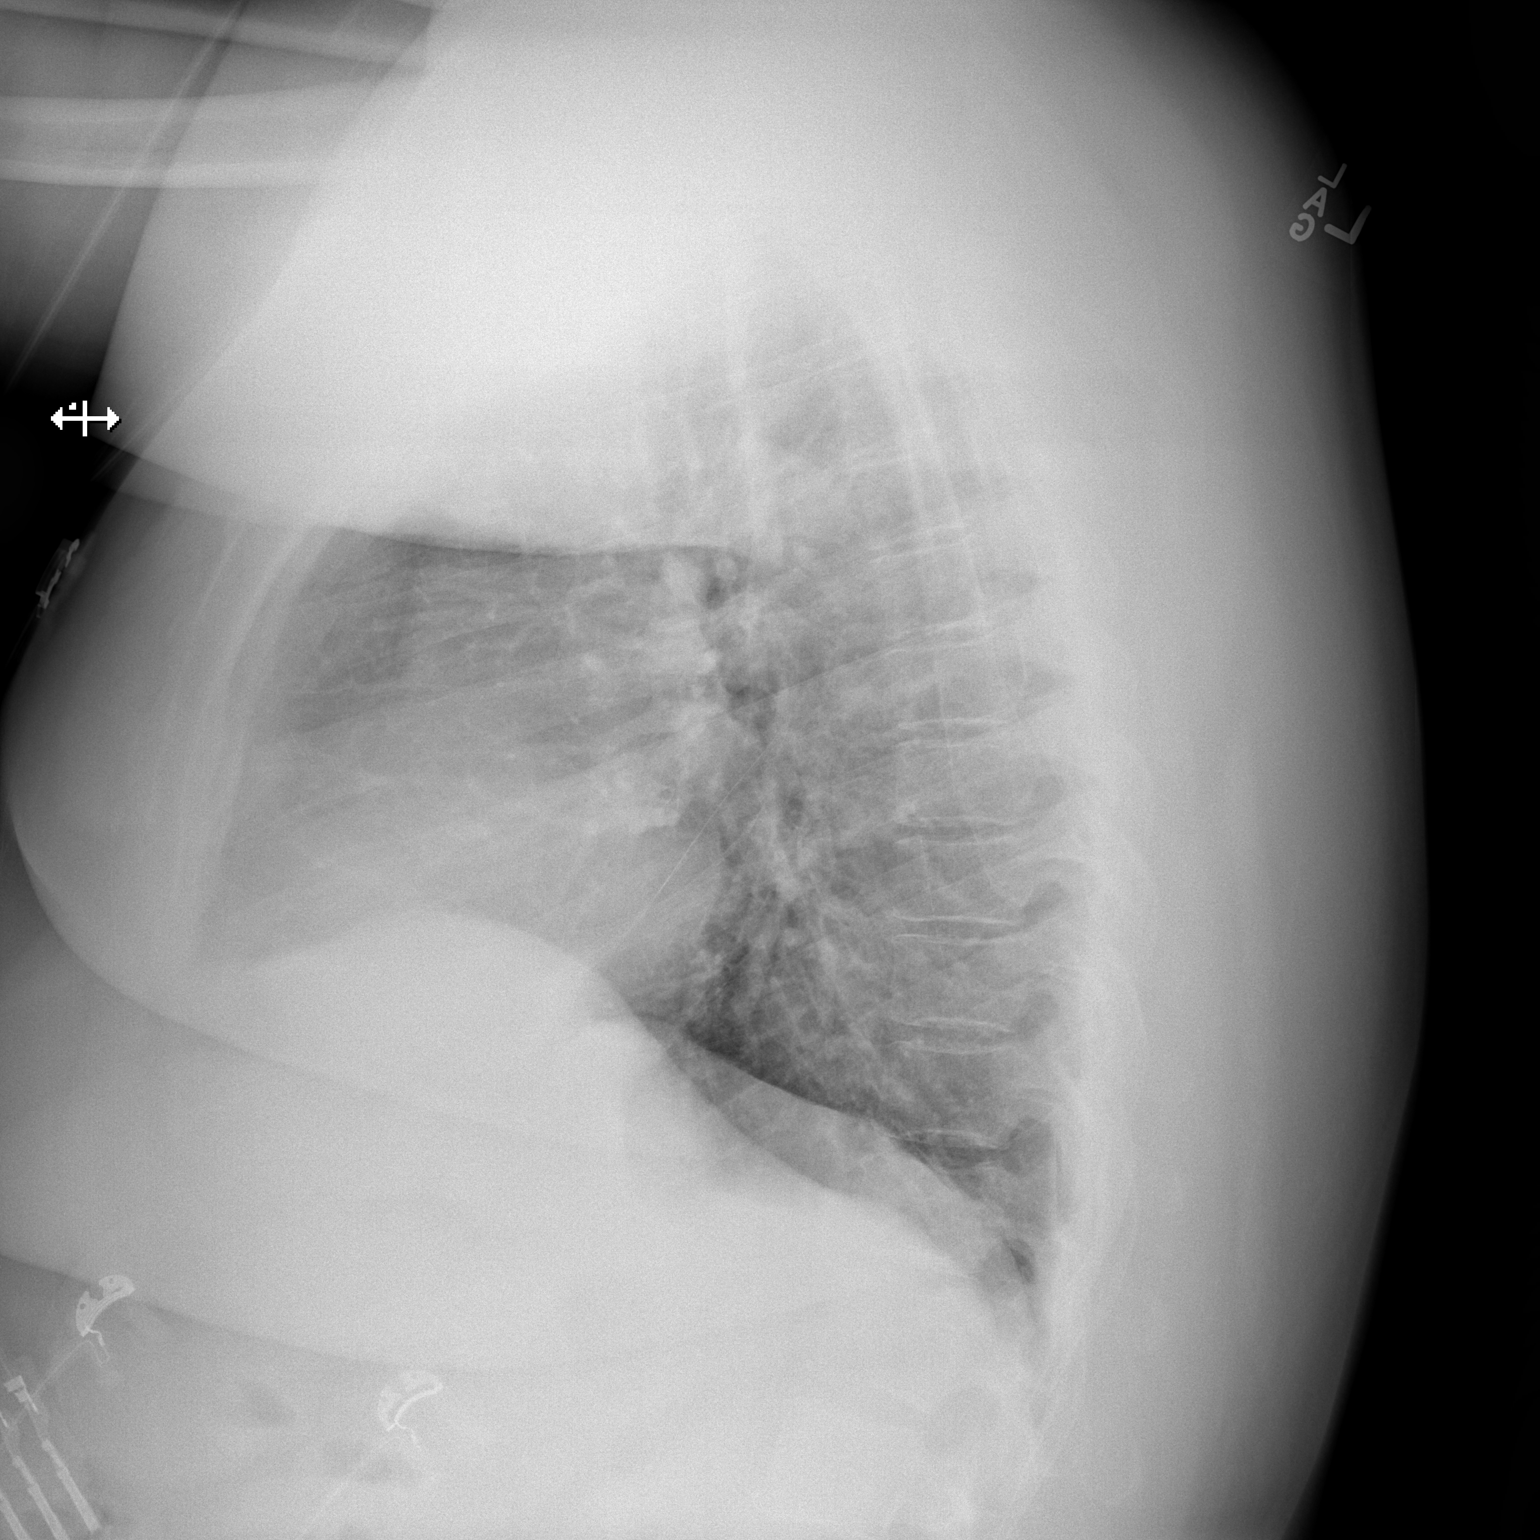

[2 of 2 positions shown; findings below may reference images not displayed]

FINDINGS: The cardiac and mediastinal silhouettes are stable in size and
contour, and remain within normal limits.

The lungs are normally inflated. No airspace consolidation, pleural
effusion, or pulmonary edema is identified. There is no
pneumothorax. Minimal left basilar atelectasis/scar.

No acute osseous abnormality identified.
IMPRESSION: Minimal left basilar atelectasis/scar. No other active
cardiopulmonary disease.

## 2022-08-04 ENCOUNTER — Emergency Department (HOSPITAL_COMMUNITY): Payer: Self-pay

## 2022-08-04 ENCOUNTER — Encounter (HOSPITAL_COMMUNITY): Payer: Self-pay

## 2022-08-04 ENCOUNTER — Emergency Department (HOSPITAL_COMMUNITY)
Admission: EM | Admit: 2022-08-04 | Discharge: 2022-08-04 | Disposition: A | Discharge status: Home / Self Care | Payer: Self-pay | Attending: Emergency Medicine | Admitting: Emergency Medicine

## 2022-08-04 DIAGNOSIS — N3001 Acute cystitis with hematuria: Secondary | ICD-10-CM | POA: Insufficient documentation

## 2022-08-04 DIAGNOSIS — R300 Dysuria: Secondary | ICD-10-CM

## 2022-08-04 LAB — URINALYSIS, ROUTINE W REFLEX MICROSCOPIC
Bacteria, UA: NONE SEEN
Bilirubin Urine: NEGATIVE
Glucose, UA: NEGATIVE mg/dL
Ketones, ur: NEGATIVE mg/dL
Nitrite: NEGATIVE
Protein, ur: NEGATIVE mg/dL
Specific Gravity, Urine: 1.024 (ref 1.005–1.030)
pH: 5 (ref 5.0–8.0)

## 2022-08-04 LAB — COMPREHENSIVE METABOLIC PANEL
ALT: 18 U/L (ref 0–44)
AST: 20 U/L (ref 15–41)
Albumin: 3.5 g/dL (ref 3.5–5.0)
Alkaline Phosphatase: 70 U/L (ref 38–126)
Anion gap: 10 (ref 5–15)
BUN: 13 mg/dL (ref 6–20)
CO2: 25 mmol/L (ref 22–32)
Calcium: 8.6 mg/dL — ABNORMAL LOW (ref 8.9–10.3)
Chloride: 99 mmol/L (ref 98–111)
Creatinine, Ser: 0.85 mg/dL (ref 0.61–1.24)
GFR, Estimated: >60 mL/min (ref >60)
Glucose, Bld: 111 mg/dL — ABNORMAL HIGH (ref 70–99)
Potassium: 3.2 mmol/L — ABNORMAL LOW (ref 3.5–5.1)
Sodium: 134 mmol/L — ABNORMAL LOW (ref 135–145)
Total Bilirubin: 0.6 mg/dL (ref 0.3–1.2)
Total Protein: 7 g/dL (ref 6.5–8.1)

## 2022-08-04 LAB — CBC WITH DIFFERENTIAL/PLATELET
Abs Immature Granulocytes: 0.02 10*3/uL (ref 0.00–0.07)
Basophils Absolute: 0.1 10*3/uL (ref 0.0–0.1)
Basophils Relative: 0 %
Eosinophils Absolute: 0.1 10*3/uL (ref 0.0–0.5)
Eosinophils Relative: 1 %
HCT: 44.2 % (ref 39.0–52.0)
Hemoglobin: 14.5 g/dL (ref 13.0–17.0)
Immature Granulocytes: 0 %
Lymphocytes Relative: 8 %
Lymphs Abs: 0.9 10*3/uL (ref 0.7–4.0)
MCH: 30 pg (ref 26.0–34.0)
MCHC: 32.8 g/dL (ref 30.0–36.0)
MCV: 91.5 fL (ref 80.0–100.0)
Monocytes Absolute: 0.5 10*3/uL (ref 0.1–1.0)
Monocytes Relative: 4 %
Neutro Abs: 10.7 10*3/uL — ABNORMAL HIGH (ref 1.7–7.7)
Neutrophils Relative %: 87 %
Platelets: 228 10*3/uL (ref 150–400)
RBC: 4.83 MIL/uL (ref 4.22–5.81)
RDW: 14.7 % (ref 11.5–15.5)
WBC: 12.3 10*3/uL — ABNORMAL HIGH (ref 4.0–10.5)
nRBC: 0 % (ref 0.0–0.2)

## 2022-08-04 MED ORDER — CEPHALEXIN 500 MG PO CAPS: 500.0000 mg | ORAL_CAPSULE | Freq: Four times a day (QID) | ORAL | 0 refills | Status: AC

## 2022-08-04 NOTE — Discharge Instructions (Addendum)
It was a pleasure taking care of you today!   Your urine showed concerns for UTI at this time. Your CT scan showed an incidental finding of hepatic cirrhosis today.  Your labs showed no significant abnormalities.  Please follow up with the above physicians       You will be sent a prescription called Keflex to treat a UTI.  You have a urine culture pending.  You will be given information for the GI specialist, call and set up a follow-up appointment regarding today's ED visit. Call [facility] to set up a follow up appointment regarding todays ED visit. You may follow up with your primary care provider regarding todays ED visit. Return to the ED if you are experiencing increasing/worsening symptoms.

## 2022-08-04 NOTE — ED Provider Notes (Signed)
[facility] Provider Note   CSN: [id number] Arrival date & time: 08/04/22  1025     History  Chief Complaint  Patient presents with   Painful Urination    Peter Parker is a 47 y.o. male who presents to the ED with concerns for dysuria onset this last night. Also noted lower back pain since last night. Notes that he noticed a drop of blood to his urethra following urination. History of UTI. Was previously evaluated by urology for his recurrent UTIs. Denies abdominal pain, nausea, vomiting.  No history of kidney stone.   The history is provided by the patient. No language interpreter was used.       Home Medications Prior to Admission medications   Medication Sig Start Date End Date Taking? Authorizing Provider  azithromycin (ZITHROMAX) 250 MG tablet Take 1 tablet (250 mg total) by mouth daily. 05/11/16   Everlene Balls, MD  naproxen sodium (ANAPROX) 220 MG tablet Take 440 mg by mouth 2 (two) times daily as needed (chest pain).    [provider]  predniSONE (DELTASONE) 20 MG tablet Take 3 tablets (60 mg total) by mouth daily. 05/11/16   Everlene Balls, MD  trimethoprim (TRIMPEX) 100 MG tablet Take 100 mg by mouth daily.    [provider]      Allergies    Patient has no known allergies.    Review of Systems   Review of Systems  All other systems reviewed and are negative.   Physical Exam Updated Vital Signs BP (!) 168/94 (BP Location: Right Arm)   Pulse 81   Temp 98.9 F (37.2 C) (Oral)   Resp 16   SpO2 94%  Physical Exam Vitals and nursing note reviewed.  Constitutional:      General: He is not in acute distress.    Appearance: Normal appearance.  Eyes:     General: No scleral icterus.    Extraocular Movements: Extraocular movements intact.  Cardiovascular:     Rate and Rhythm: Normal rate.  Pulmonary:     Effort: Pulmonary effort is normal. No respiratory distress.  Abdominal:     General:  Abdomen is flat. Bowel sounds are normal.     Palpations: Abdomen is soft. There is no mass.     Tenderness: There is no abdominal tenderness. There is no right CVA tenderness or left CVA tenderness.     Comments: No abdominal TTP. No CVA TTP.   Musculoskeletal:        General: Normal range of motion.     Cervical back: Neck supple.     Comments: No spinal TTP. No TTP noted to musculature of back  Skin:    General: Skin is warm and dry.     Findings: No rash.  Neurological:     Mental Status: He is alert.     Sensory: Sensation is intact.     Motor: Motor function is intact.  Psychiatric:        Behavior: Behavior normal.     ED Results / Procedures / Treatments   Labs (all labs ordered are listed, but only abnormal results are displayed) Labs Reviewed  URINALYSIS, ROUTINE W REFLEX MICROSCOPIC - Abnormal; Notable for the following components:      Result Value   APPearance TURBID (*)    Hgb urine dipstick SMALL (*)    Leukocytes,Ua MODERATE (*)    All other components within normal limits  COMPREHENSIVE METABOLIC PANEL  CBC WITH  DIFFERENTIAL/PLATELET    EKG None  Radiology CT Renal Stone Study  Result Date: 08/04/2022 CLINICAL DATA:  Abdominal and flank pain. EXAM: CT ABDOMEN AND PELVIS WITHOUT CONTRAST TECHNIQUE: Multidetector CT imaging of the abdomen and pelvis was performed following the standard protocol without IV contrast. RADIATION DOSE REDUCTION: This exam was performed according to the departmental dose-optimization program which includes automated exposure control, adjustment of the mA and/or kV according to patient size and/or use of iterative reconstruction technique. COMPARISON:  10/25/2014 FINDINGS: Lower chest: No acute findings. Hepatobiliary: No mass visualized on this unenhanced exam. Hypertrophy of left hepatic lobe and mild capsular nodularity raise suspicion for hepatic cirrhosis. Gallbladder is unremarkable. No evidence of biliary ductal dilatation.  Pancreas: No mass or inflammatory process visualized on this unenhanced exam. Spleen:  Within normal limits in size. Adrenals/Urinary tract: No evidence of urolithiasis or hydronephrosis. Unremarkable unopacified urinary bladder. Stomach/Bowel: No evidence of obstruction, inflammatory process, or abnormal fluid collections. Normal appendix visualized. Vascular/Lymphatic: No pathologically enlarged lymph nodes identified. No evidence of abdominal aortic aneurysm. Reproductive:  No mass or other significant abnormality. Other:  None. Musculoskeletal:  No suspicious bone lesions identified. IMPRESSION: No evidence of urolithiasis, hydronephrosis, or other acute findings. Question hepatic cirrhosis. Recommend correlation with liver function tests and serology. Electronically Signed   By: Marlaine Hind M.D.   On: 08/04/2022 14:09    Procedures Procedures    Medications Ordered in ED Medications - No data to display  ED Course/ Medical Decision Making/ A&P Clinical Course as of 08/04/22 1440  Sat Aug 04, 2022  1436 Discussed with patient and wife regarding incidental findings noted on CT scan.  Also discussed with wife and patient regarding urinalysis findings.  Patient does not have a primary care provider at this time.  Will order additional labs.  Patient notes that he drinks approximately 6-8 shots of liquor each night and has been doing so for "a while". [SB]    Clinical Course User Index [SB] Janashia Parco A, PA-C                             Medical Decision Making Amount and/or Complexity of Data Reviewed Labs: ordered. Radiology: ordered.   Pt presents with concerns for hematuria onset last night. History of UTIs. No history of kidney stones. Vital signs, pt afebrile, not tachycardic or hypoxic. On exam, pt with no abdominal TTP. No CVA TTP noted bilaterally. Differential diagnosis includes acute cystitis, nephrolithiasis, pyelonephritis.    Co morbidities that complicate the patient  evaluation: UTI  Additional history obtained:  Additional history obtained from Spouse/Significant Other  Labs:  I ordered, and personally interpreted labs.  The pertinent results include:   Urinalysis notable for small amount of hemoglobin and moderate leukocytes CBC and CMP ordered with results pending at time of sign out.  Urine culture ordered.   Imaging: I ordered imaging studies including CT renal stone study I independently visualized and interpreted imaging which showed:  No evidence of urolithiasis, hydronephrosis, or other acute  findings.  Question hepatic cirrhosis. Recommend correlation with liver  function tests and serology.   I agree with the radiologist interpretation  Patient case discussed with Margarita Mail, PA-C at sign-out. Plan at sign-out is labs, likely Discharge home, however, plans may change as per oncoming team. Patient care transferred at sign out.     This chart was dictated using voice recognition software, Dragon. Despite the best efforts of this provider  to proofread and correct errors, errors may still occur which can change documentation meaning.   Final Clinical Impression(s) / ED Diagnoses Final diagnoses:  Dysuria  Acute cystitis with hematuria    Rx / DC Orders ED Discharge Orders     None         Markevius Trombetta A, PA-C 08/04/22 1459    Alvira Monday, MD 08/04/22 1525

## 2022-08-04 NOTE — ED Triage Notes (Signed)
Pt. Arrive POV stating that that he thinks he has a bladder infection. Pt. States that he is having penile pain after he urinates. He also states that when he wipes his penis there are blood spots on the toilet paper. Pt. Also endores back pain.

## 2022-08-04 NOTE — Progress Notes (Signed)
TOC consulted for PCP needs. This CSW attached a PCP and the pt will need to call and schedule a new patient appt. No further TOC needs. TOC signing off.
